# Patient Record
Sex: Male | Born: 1980
Health system: Southern US, Community
[De-identification: ages and names within clinical notes are randomized; demographics above are authoritative.]

## PROBLEM LIST (undated history)

## (undated) DIAGNOSIS — IMO0001 Reserved for inherently not codable concepts without codable children: Secondary | ICD-10-CM

## (undated) DIAGNOSIS — T1490XA Injury, unspecified, initial encounter: Secondary | ICD-10-CM

## (undated) HISTORY — DX: Injury, unspecified, initial encounter: T14.90XA

## (undated) HISTORY — DX: Reserved for inherently not codable concepts without codable children: IMO0001

## (undated) HISTORY — PX: WISDOM TOOTH EXTRACTION: SHX21

---

## 2015-02-24 ENCOUNTER — Encounter: Payer: Self-pay | Admitting: *Deleted

## 2015-02-24 ENCOUNTER — Telehealth: Payer: Self-pay | Admitting: *Deleted

## 2015-02-24 NOTE — Telephone Encounter (Signed)
Pre-Visit Call completed with patient and chart updated.   Pre-Visit Info documented in Specialty Comments under [employer].    

## 2015-02-25 ENCOUNTER — Ambulatory Visit (INDEPENDENT_AMBULATORY_CARE_PROVIDER_SITE_OTHER): Payer: BLUE CROSS/BLUE SHIELD | Admitting: Physician Assistant

## 2015-02-25 ENCOUNTER — Encounter: Payer: Self-pay | Admitting: Physician Assistant

## 2015-02-25 VITALS — BP 118/78 | HR 66 | Temp 97.9°F | Ht 71.25 in | Wt 171.2 lb

## 2015-02-25 DIAGNOSIS — Z0000 Encounter for general adult medical examination without abnormal findings: Secondary | ICD-10-CM

## 2015-02-25 DIAGNOSIS — M25531 Pain in right wrist: Secondary | ICD-10-CM | POA: Diagnosis not present

## 2015-02-25 DIAGNOSIS — Z23 Encounter for immunization: Secondary | ICD-10-CM | POA: Diagnosis not present

## 2015-02-25 DIAGNOSIS — M25532 Pain in left wrist: Secondary | ICD-10-CM

## 2015-02-25 NOTE — Assessment & Plan Note (Signed)
Depression screen negative. TDaP updated today. Preventive schedule reviewed and handout given in AVS. Will obtain fasting labs today.

## 2015-02-25 NOTE — Patient Instructions (Signed)
Please go to the lab for blood work. I will call you with your results.  Continue the Tylenol arthritis as directed. 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 Apply topical Aspercreme to the wrist. If labs normal and this worsens, we will want to get an x-ray to assess further.  Preventive Care for Adults A healthy lifestyle and preventive care can promote health and wellness. Preventive health guidelines for men include the following key practices:  A routine yearly physical is a good way to check with your health care provider about your health and preventative screening. It is a chance to share any concerns and updates on your health and to receive a thorough exam.  Visit your dentist for a routine exam and preventative care every 6 months. Brush your teeth twice a day and floss once a day. Good oral hygiene prevents tooth decay and gum disease.  The frequency of eye exams is based on your age, health, family medical history, use of contact lenses, and other factors. Follow your health care provider's recommendations for frequency of eye exams.  Eat a healthy diet. Foods such as vegetables, fruits, whole grains, low-fat dairy products, and lean protein foods contain the nutrients you need without too many calories. Decrease your intake of foods high in solid fats, added sugars, and salt. Eat the right amount of calories for you.Get information about a proper diet from your health care provider, if necessary.  Regular physical exercise is one of the most important things you can do for your health. Most adults should get at least 150 minutes of moderate-intensity exercise (any activity that increases your heart rate and causes you to sweat) each week. In addition, most adults need muscle-strengthening exercises on 2 or more days a week.  Maintain a healthy weight. The body mass index (BMI) is a screening tool to identify possible weight problems. It provides  an estimate of body fat based on height and weight. Your health care provider can find your BMI and can help you achieve or maintain a healthy weight.For adults 20 years and older:  A BMI below 18.5 is considered underweight.  A BMI of 18.5 to 24.9 is normal.  A BMI of 25 to 29.9 is considered overweight.  A BMI of 30 and above is considered obese.  Maintain normal blood lipids and cholesterol levels by exercising and minimizing your intake of saturated fat. Eat a balanced diet with plenty of fruit and vegetables. Blood tests for lipids and cholesterol should begin at age 2 and be repeated every 5 years. If your lipid or cholesterol levels are high, you are over 50, or you are at high risk for heart disease, you may need your cholesterol levels checked more frequently.Ongoing high lipid and cholesterol levels should be treated with medicines if diet and exercise are not working.  If you smoke, find out from your health care provider how to quit. If you do not use tobacco, do not start.  Lung cancer screening is recommended for adults aged 37-80 years who are at high risk for developing lung cancer because of a history of smoking. A yearly low-dose CT scan of the lungs is recommended for people who have at least a 30-pack-year history of smoking and are a current smoker or have quit within the past 15 years. A pack year of smoking is smoking an average of 1 pack of cigarettes a day for 1 year (for example: 1 pack a day for 30 years  or 2 packs a day for 15 years). Yearly screening should continue until the smoker has stopped smoking for at least 15 years. Yearly screening should be stopped for people who develop a health problem that would prevent them from having lung cancer treatment.  If you choose to drink alcohol, do not have more than 2 drinks per day. One drink is considered to be 12 ounces (355 mL) of beer, 5 ounces (148 mL) of wine, or 1.5 ounces (44 mL) of liquor.  Avoid use of street  drugs. Do not share needles with anyone. Ask for help if you need support or instructions about stopping the use of drugs.  High blood pressure causes heart disease and increases the risk of stroke. Your blood pressure should be checked at least every 1-2 years. Ongoing high blood pressure should be treated with medicines, if weight loss and exercise are not effective.  If you are 55-51 years old, ask your health care provider if you should take aspirin to prevent heart disease.  Diabetes screening involves taking a blood sample to check your fasting blood sugar level. This should be done once every 3 years, after age 61, if you are within normal weight and without risk factors for diabetes. Testing should be considered at a younger age or be carried out more frequently if you are overweight and have at least 1 risk factor for diabetes.  Colorectal cancer can be detected and often prevented. Most routine colorectal cancer screening begins at the age of 58 and continues through age 71. However, your health care provider may recommend screening at an earlier age if you have risk factors for colon cancer. On a yearly basis, your health care provider may provide home test kits to check for hidden blood in the stool. Use of a small camera at the end of a tube to directly examine the colon (sigmoidoscopy or colonoscopy) can detect the earliest forms of colorectal cancer. Talk to your health care provider about this at age 14, when routine screening begins. Direct exam of the colon should be repeated every 5-10 years through age 3, unless early forms of precancerous polyps or small growths are found.  People who are at an increased risk for hepatitis B should be screened for this virus. You are considered at high risk for hepatitis B if:  You were born in a country where hepatitis B occurs often. Talk with your health care provider about which countries are considered high risk.  Your parents were born in a  high-risk country and you have not received a shot to protect against hepatitis B (hepatitis B vaccine).  You have HIV or AIDS.  You use needles to inject street drugs.  You live with, or have sex with, someone who has hepatitis B.  You are a man who has sex with other men (MSM).  You get hemodialysis treatment.  You take certain medicines for conditions such as cancer, organ transplantation, and autoimmune conditions.  Hepatitis C blood testing is recommended for all people born from [birth year] through 1965 and any individual with known risks for hepatitis C.  Practice safe sex. Use condoms and avoid high-risk sexual practices to reduce the spread of sexually transmitted infections (STIs). STIs include gonorrhea, chlamydia, syphilis, trichomonas, herpes, HPV, and human immunodeficiency virus (HIV). Herpes, HIV, and HPV are viral illnesses that have no cure. They can result in disability, cancer, and death.  If you are at risk of being infected with HIV, it is recommended that  you take a prescription medicine daily to prevent HIV infection. This is called preexposure prophylaxis (PrEP). You are considered at risk if:  You are a man who has sex with other men (MSM) and have other risk factors.  You are a heterosexual man, are sexually active, and are at increased risk for HIV infection.  You take drugs by injection.  You are sexually active with a partner who has HIV.  Talk with your health care provider about whether you are at high risk of being infected with HIV. If you choose to begin PrEP, you should first be tested for HIV. You should then be tested every 3 months for as long as you are taking PrEP.  A one-time screening for abdominal aortic aneurysm (AAA) and surgical repair of large AAAs by ultrasound are recommended for men ages 65 to 57 years who are current or former smokers.  Healthy men should no longer receive prostate-specific antigen (PSA) blood tests as part of routine  cancer screening. Talk with your health care provider about prostate cancer screening.  Testicular cancer screening is not recommended for adult males who have no symptoms. Screening includes self-exam, a health care provider exam, and other screening tests. Consult with your health care provider about any symptoms you have or any concerns you have about testicular cancer.  Use sunscreen. Apply sunscreen liberally and repeatedly throughout the day. You should seek shade when your shadow is shorter than you. Protect yourself by wearing long sleeves, pants, a wide-brimmed hat, and sunglasses year round, whenever you are outdoors.  Once a month, do a whole-body skin exam, using a mirror to look at the skin on your back. Tell your health care provider about new moles, moles that have irregular borders, moles that are larger than a pencil eraser, or moles that have changed in shape or color.  Stay current with required vaccines (immunizations).  Influenza vaccine. All adults should be immunized every year.  Tetanus, diphtheria, and acellular pertussis (Td, Tdap) vaccine. An adult who has not previously received Tdap or who does not know his vaccine status should receive 1 dose of Tdap. This initial dose should be followed by tetanus and diphtheria toxoids (Td) booster doses every 10 years. Adults with an unknown or incomplete history of completing a 3-dose immunization series with Td-containing vaccines should begin or complete a primary immunization series including a Tdap dose. Adults should receive a Td booster every 10 years.  Varicella vaccine. An adult without evidence of immunity to varicella should receive 2 doses or a second dose if he has previously received 1 dose.  Human papillomavirus (HPV) vaccine. Males aged 10-21 years who have not received the vaccine previously should receive the 3-dose series. Males aged 22-26 years may be immunized. Immunization is recommended through the age of 79  years for any male who has sex with males and did not get any or all doses earlier. Immunization is recommended for any person with an immunocompromised condition through the age of 56 years if he did not get any or all doses earlier. During the 3-dose series, the second dose should be obtained 4-8 weeks after the first dose. The third dose should be obtained 24 weeks after the first dose and 16 weeks after the second dose.  Zoster vaccine. One dose is recommended for adults aged 13 years or older unless certain conditions are present.  Measles, mumps, and rubella (MMR) vaccine. Adults born before 55 generally are considered immune to measles and mumps. Adults  born in [place] or later should have 1 or more doses of MMR vaccine unless there is a contraindication to the vaccine or there is laboratory evidence of immunity to each of the three diseases. A routine second dose of MMR vaccine should be obtained at least 28 days after the first dose for students attending postsecondary schools, health care workers, or international travelers. People who received inactivated measles vaccine or an unknown type of measles vaccine during 1963-1967 should receive 2 doses of MMR vaccine. People who received inactivated mumps vaccine or an unknown type of mumps vaccine before 1979 and are at high risk for mumps infection should consider immunization with 2 doses of MMR vaccine. Unvaccinated health care workers born before [birth year] who lack laboratory evidence of measles, mumps, or rubella immunity or laboratory confirmation of disease should consider measles and mumps immunization with 2 doses of MMR vaccine or rubella immunization with 1 dose of MMR vaccine.  Pneumococcal 13-valent conjugate (PCV13) vaccine. When indicated, a person who is uncertain of his immunization history and has no record of immunization should receive the PCV13 vaccine. An adult aged 3 years or older who has certain medical conditions and has not been  previously immunized should receive 1 dose of PCV13 vaccine. This PCV13 should be followed with a dose of pneumococcal polysaccharide (PPSV23) vaccine. The PPSV23 vaccine dose should be obtained at least 8 weeks after the dose of PCV13 vaccine. An adult aged 66 years or older who has certain medical conditions and previously received 1 or more doses of PPSV23 vaccine should receive 1 dose of PCV13. The PCV13 vaccine dose should be obtained 1 or more years after the last PPSV23 vaccine dose.  Pneumococcal polysaccharide (PPSV23) vaccine. When PCV13 is also indicated, PCV13 should be obtained first. All adults aged 49 years and older should be immunized. An adult younger than age 15 years who has certain medical conditions should be immunized. Any person who resides in a nursing home or long-term care facility should be immunized. An adult smoker should be immunized. People with an immunocompromised condition and certain other conditions should receive both PCV13 and PPSV23 vaccines. People with human immunodeficiency virus (HIV) infection should be immunized as soon as possible after diagnosis. Immunization during chemotherapy or radiation therapy should be avoided. Routine use of PPSV23 vaccine is not recommended for American Indians, [place] Natives, or people younger than 65 years unless there are medical conditions that require PPSV23 vaccine. When indicated, people who have unknown immunization and have no record of immunization should receive PPSV23 vaccine. One-time revaccination 5 years after the first dose of PPSV23 is recommended for people aged 19-64 years who have chronic kidney failure, nephrotic syndrome, asplenia, or immunocompromised conditions. People who received 1-2 doses of PPSV23 before age 88 years should receive another dose of PPSV23 vaccine at age 45 years or later if at least 5 years have passed since the previous dose. Doses of PPSV23 are not needed for people immunized with PPSV23 at or  after age 32 years.  Meningococcal vaccine. Adults with asplenia or persistent complement component deficiencies should receive 2 doses of quadrivalent meningococcal conjugate (MenACWY-D) vaccine. The doses should be obtained at least 2 months apart. Microbiologists working with certain meningococcal bacteria, [place] recruits, people at risk during an outbreak, and people who travel to or live in countries with a high rate of meningitis should be immunized. A first-year college student up through age 46 years who is living in a residence hall should receive a  dose if he did not receive a dose on or after his 16th birthday. Adults who have certain high-risk conditions should receive one or more doses of vaccine.  Hepatitis A vaccine. Adults who wish to be protected from this disease, have certain high-risk conditions, work with hepatitis A-infected animals, work in hepatitis A research labs, or travel to or work in countries with a high rate of hepatitis A should be immunized. Adults who were previously unvaccinated and who anticipate close contact with an international adoptee during the first 60 days after arrival in the [place] from a country with a high rate of hepatitis A should be immunized.  Hepatitis B vaccine. Adults should be immunized if they wish to be protected from this disease, have certain high-risk conditions, may be exposed to blood or other infectious body fluids, are household contacts or sex partners of hepatitis B positive people, are clients or workers in certain care facilities, or travel to or work in countries with a high rate of hepatitis B.  Haemophilus influenzae type b (Hib) vaccine. A previously unvaccinated person with asplenia or sickle cell disease or having a scheduled splenectomy should receive 1 dose of Hib vaccine. Regardless of previous immunization, a recipient of a hematopoietic stem cell transplant should receive a 3-dose series 6-12 months after his  successful transplant. Hib vaccine is not recommended for adults with HIV infection. Preventive Service / Frequency Ages 56 to 43  Blood pressure check.** / Every 1 to 2 years.  Lipid and cholesterol check.** / Every 5 years beginning at age 72.  Hepatitis C blood test.** / For any individual with known risks for hepatitis C.  Skin self-exam. / Monthly.  Influenza vaccine. / Every year.  Tetanus, diphtheria, and acellular pertussis (Tdap, Td) vaccine.** / Consult your health care provider. 1 dose of Td every 10 years.  Varicella vaccine.** / Consult your health care provider.  HPV vaccine. / 3 doses over 6 months, if 64 or younger.  Measles, mumps, rubella (MMR) vaccine.** / You need at least 1 dose of MMR if you were born in 1957 or later. You may also need a second dose.  Pneumococcal 13-valent conjugate (PCV13) vaccine.** / Consult your health care provider.  Pneumococcal polysaccharide (PPSV23) vaccine.** / 1 to 2 doses if you smoke cigarettes or if you have certain conditions.  Meningococcal vaccine.** / 1 dose if you are age 51 to 101 years and a Market researcher living in a residence hall, or have one of several medical conditions. You may also need additional booster doses.  Hepatitis A vaccine.** / Consult your health care provider.  Hepatitis B vaccine.** / Consult your health care provider.  Haemophilus influenzae type b (Hib) vaccine.** / Consult your health care provider. Ages 54 to 8  Blood pressure check.** / Every 1 to 2 years.  Lipid and cholesterol check.** / Every 5 years beginning at age 53.  Lung cancer screening. / Every year if you are aged 37-80 years and have a 30-pack-year history of smoking and currently smoke or have quit within the past 15 years. Yearly screening is stopped once you have quit smoking for at least 15 years or develop a health problem that would prevent you from having lung cancer treatment.  Fecal occult blood test (FOBT)  of stool. / Every year beginning at age 58 and continuing until age 59. You may not have to do this test if you get a colonoscopy every 10 years.  Flexible sigmoidoscopy** or colonoscopy.** /  Every 5 years for a flexible sigmoidoscopy or every 10 years for a colonoscopy beginning at age 63 and continuing until age 16.  Hepatitis C blood test.** / For all people born from [birth year] through 1965 and any individual with known risks for hepatitis C.  Skin self-exam. / Monthly.  Influenza vaccine. / Every year.  Tetanus, diphtheria, and acellular pertussis (Tdap/Td) vaccine.** / Consult your health care provider. 1 dose of Td every 10 years.  Varicella vaccine.** / Consult your health care provider.  Zoster vaccine.** / 1 dose for adults aged 18 years or older.  Measles, mumps, rubella (MMR) vaccine.** / You need at least 1 dose of MMR if you were born in 1957 or later. You may also need a second dose.  Pneumococcal 13-valent conjugate (PCV13) vaccine.** / Consult your health care provider.  Pneumococcal polysaccharide (PPSV23) vaccine.** / 1 to 2 doses if you smoke cigarettes or if you have certain conditions.  Meningococcal vaccine.** / Consult your health care provider.  Hepatitis A vaccine.** / Consult your health care provider.  Hepatitis B vaccine.** / Consult your health care provider.  Haemophilus influenzae type b (Hib) vaccine.** / Consult your health care provider. Ages 78 and over  Blood pressure check.** / Every 1 to 2 years.  Lipid and cholesterol check.**/ Every 5 years beginning at age 1.  Lung cancer screening. / Every year if you are aged 67-80 years and have a 30-pack-year history of smoking and currently smoke or have quit within the past 15 years. Yearly screening is stopped once you have quit smoking for at least 15 years or develop a health problem that would prevent you from having lung cancer treatment.  Fecal occult blood test (FOBT) of stool. / Every year  beginning at age 43 and continuing until age 32. You may not have to do this test if you get a colonoscopy every 10 years.  Flexible sigmoidoscopy** or colonoscopy.** / Every 5 years for a flexible sigmoidoscopy or every 10 years for a colonoscopy beginning at age 39 and continuing until age 90.  Hepatitis C blood test.** / For all people born from [birth year] through 1965 and any individual with known risks for hepatitis C.  Abdominal aortic aneurysm (AAA) screening.** / A one-time screening for ages 3 to 27 years who are current or former smokers.  Skin self-exam. / Monthly.  Influenza vaccine. / Every year.  Tetanus, diphtheria, and acellular pertussis (Tdap/Td) vaccine.** / 1 dose of Td every 10 years.  Varicella vaccine.** / Consult your health care provider.  Zoster vaccine.** / 1 dose for adults aged 40 years or older.  Pneumococcal 13-valent conjugate (PCV13) vaccine.** / Consult your health care provider.  Pneumococcal polysaccharide (PPSV23) vaccine.** / 1 dose for all adults aged 78 years and older.  Meningococcal vaccine.** / Consult your health care provider.  Hepatitis A vaccine.** / Consult your health care provider.  Hepatitis B vaccine.** / Consult your health care provider.  Haemophilus influenzae type b (Hib) vaccine.** / Consult your health care provider. **Family history and personal history of risk and conditions may change your health care provider's recommendations. Document Released: 09/13/2001 Document Revised: 07/23/2013 Document Reviewed: 12/13/2010 [facility] Patient Information 2015 [place], [place]. This information is not intended to replace advice given to you by your health care provider. Make sure you discuss any questions you have with your health care provider.

## 2015-02-25 NOTE — Assessment & Plan Note (Signed)
Suspect OA. Extremities are neurovascularly intact. Continue Tylenol Arthritis. Place Aspercreme to the area.

## 2015-02-25 NOTE — Progress Notes (Signed)
Patient presents to establish care. Patient requesting CPE. Patient is fasting for labs.  Acute Concerns: Patient complains of intermittent wrist pain, mostly posterior . Denies history of arthritis. Was an athlete in school so he knows there is wear and tear on the joints. No history of autoimmune disease. Notes mild fa7+ hours of sleep per night. Well-balanced diet. Gym 1.5-2 hours per day.  Chronic Issues: None reported.  Health Maintenance: Dental -- up-to-date Vision -- up-to-date Immunizations -- Due for Tetanus  Past Medical History  Diagnosis Date  . Healthy adult   . Sports injury     Past Surgical History  Procedure Laterality Date  . Wisdom tooth extraction      Current Outpatient Prescriptions on File Prior to Visit  Medication Sig Dispense Refill  . Acetaminophen (TYLENOL ARTHRITIS PAIN PO) Take 1 tablet by mouth daily.    . Cyanocobalamin (B-12 PO) Take 1 tablet by mouth daily.     No current facility-administered medications on file prior to visit.    No Known Allergies  Family History  Problem Relation Age of Onset  . Arthritis Mother   . Hyperlipidemia Mother   . Stroke Mother   . Hypertension Mother   . Mental illness Mother   . Cancer Mother     Brain   . Diabetes Mother   . Arthritis Father   . Hyperlipidemia Father   . Hypertension Father   . Hyperlipidemia Maternal Grandmother   . Heart disease Maternal Grandmother   . Cancer Maternal Grandfather     Prostate  . Hyperlipidemia Maternal Grandfather   . Heart disease Maternal Grandfather   . Stroke Maternal Grandfather   . Diabetes Maternal Grandfather   . Hyperlipidemia Paternal Grandmother   . Heart disease Paternal Grandmother   . Hyperlipidemia Paternal Grandfather   . Heart disease Paternal Grandfather     History   Social History  . Marital Status: Married    Spouse Name: Marcelino Duster  . Number of Children: 0  . Years of Education: N/A   Occupational History  . Salesman     Social History Main Topics  . Smoking status: Never Smoker   . Smokeless tobacco: Never Used  . Alcohol Use: 3.0 oz/week    5 Standard drinks or equivalent per week  . Drug Use: No  . Sexual Activity:    Partners: Female   Other Topics Concern  . Not on file   Social History Narrative   Review of Systems  Constitutional: Positive for malaise/fatigue. Negative for fever and weight loss.  HENT: Negative for ear discharge, ear pain, hearing loss and tinnitus.   Eyes: Negative for blurred vision, double vision, photophobia and pain.  Respiratory: Negative for cough and shortness of breath.   Cardiovascular: Negative for chest pain and palpitations.  Gastrointestinal: Negative for heartburn, nausea, vomiting, abdominal pain, diarrhea, constipation, blood in stool and melena.  Genitourinary: Negative for dysuria, urgency, frequency, hematuria and flank pain.  Musculoskeletal: Positive for joint pain. Negative for falls.  Neurological: Negative for dizziness, loss of consciousness and headaches.  Endo/Heme/Allergies: Negative for environmental allergies.  Psychiatric/Behavioral: Negative for depression, suicidal ideas, hallucinations and substance abuse. The patient is not nervous/anxious and does not have insomnia.    BP 118/78 mmHg  Pulse 66  Temp(Src) 97.9 F (36.6 C) (Oral)  Ht 5' 11.25" (1.81 m)  Wt 171 lb 3.2 oz (77.656 kg)  BMI 23.70 kg/m2  SpO2 99%  Physical Exam  Constitutional: He is oriented  to person, place, and time and well-developed, well-nourished, and in no distress.  HENT:  Head: Normocephalic and atraumatic.  Right Ear: External ear normal.  Left Ear: External ear normal.  Nose: Nose normal.  Mouth/Throat: Oropharynx is clear and moist. No oropharyngeal exudate.  Eyes: Conjunctivae and EOM are normal. Pupils are equal, round, and reactive to light.  Neck: Neck supple. No thyromegaly present.  Cardiovascular: Normal rate, regular rhythm, normal heart  sounds and intact distal pulses.   Pulmonary/Chest: Effort normal and breath sounds normal. No respiratory distress. He has no wheezes. He has no rales. He exhibits no tenderness.  Abdominal: Soft. Bowel sounds are normal. He exhibits no distension and no mass. There is no tenderness. There is no rebound and no guarding.  Genitourinary: Testes/scrotum normal and penis normal. No discharge found.  Lymphadenopathy:    He has no cervical adenopathy.  Neurological: He is alert and oriented to person, place, and time.  Skin: Skin is warm and dry. No rash noted.  Psychiatric: Affect normal.  Vitals reviewed.  Assessment/Plan: Visit for preventive health examination Depression screen negative. TDaP updated today. Preventive schedule reviewed and handout given in AVS. Will obtain fasting labs today.  Pain in both wrists Suspect OA. Extremities are neurovascularly intact. Continue Tylenol Arthritis. Place Aspercreme to the area.

## 2015-02-25 NOTE — Progress Notes (Signed)
Pre visit review using our clinic review tool, if applicable. No additional management support is needed unless otherwise documented below in the visit note. 

## 2015-02-26 LAB — URINALYSIS, ROUTINE W REFLEX MICROSCOPIC
BILIRUBIN URINE: NEGATIVE
Hgb urine dipstick: NEGATIVE
Leukocytes, UA: NEGATIVE
Nitrite: NEGATIVE
RBC / HPF: NONE SEEN (ref 0–?)
Specific Gravity, Urine: 1.02 (ref 1.000–1.030)
Total Protein, Urine: NEGATIVE
URINE GLUCOSE: NEGATIVE
Urobilinogen, UA: 0.2 (ref 0.0–1.0)
WBC UA: NONE SEEN (ref 0–?)
pH: 6 (ref 5.0–8.0)

## 2015-02-26 LAB — LIPID PANEL
Cholesterol: 192 mg/dL (ref 0–200)
HDL: 62.9 mg/dL (ref >39.00)
LDL Cholesterol: 114 mg/dL — ABNORMAL HIGH (ref 0–99)
NONHDL: 129.52
Total CHOL/HDL Ratio: 3
Triglycerides: 77 mg/dL (ref 0.0–149.0)
VLDL: 15.4 mg/dL (ref 0.0–40.0)

## 2015-02-26 LAB — COMPREHENSIVE METABOLIC PANEL
ALT: 27 U/L (ref 0–53)
AST: 26 U/L (ref 0–37)
Albumin: 4.5 g/dL (ref 3.5–5.2)
Alkaline Phosphatase: 47 U/L (ref 39–117)
BILIRUBIN TOTAL: 0.7 mg/dL (ref 0.2–1.2)
BUN: 26 mg/dL — AB (ref 6–23)
CHLORIDE: 101 meq/L (ref 96–112)
CO2: 28 mEq/L (ref 19–32)
CREATININE: 1.24 mg/dL (ref 0.40–1.50)
Calcium: 9.4 mg/dL (ref 8.4–10.5)
GFR: 70.99 mL/min (ref >60.00)
Glucose, Bld: 79 mg/dL (ref 70–99)
POTASSIUM: 3.9 meq/L (ref 3.5–5.1)
Sodium: 138 mEq/L (ref 135–145)
Total Protein: 7.5 g/dL (ref 6.0–8.3)

## 2015-02-26 LAB — SEDIMENTATION RATE: SED RATE: 5 mm/h (ref 0–22)

## 2015-02-26 LAB — CBC
HCT: 45.3 % (ref 39.0–52.0)
Hemoglobin: 15.3 g/dL (ref 13.0–17.0)
MCHC: 33.8 g/dL (ref 30.0–36.0)
MCV: 88.9 fl (ref 78.0–100.0)
PLATELETS: 268 10*3/uL (ref 150.0–400.0)
RBC: 5.09 Mil/uL (ref 4.22–5.81)
RDW: 14 % (ref 11.5–15.5)
WBC: 5.3 10*3/uL (ref 4.0–10.5)

## 2015-02-26 LAB — TSH: TSH: 1 u[IU]/mL (ref 0.35–4.50)

## 2015-02-27 NOTE — Addendum Note (Signed)
Addended by: Verdie Shire on: 02/27/2015 05:50 PM   Modules accepted: Orders

## 2016-09-08 DIAGNOSIS — F4322 Adjustment disorder with anxiety: Secondary | ICD-10-CM | POA: Diagnosis not present

## 2016-09-19 ENCOUNTER — Ambulatory Visit (HOSPITAL_BASED_OUTPATIENT_CLINIC_OR_DEPARTMENT_OTHER)
Admission: RE | Admit: 2016-09-19 | Discharge: 2016-09-19 | Disposition: A | Discharge status: Home / Self Care | Payer: BLUE CROSS/BLUE SHIELD | Source: Ambulatory Visit | Attending: Family | Admitting: Family

## 2016-09-19 ENCOUNTER — Encounter: Payer: Self-pay | Admitting: Family

## 2016-09-19 ENCOUNTER — Ambulatory Visit (INDEPENDENT_AMBULATORY_CARE_PROVIDER_SITE_OTHER): Payer: BLUE CROSS/BLUE SHIELD | Admitting: Family

## 2016-09-19 VITALS — BP 140/78 | HR 55 | Temp 97.8°F | Ht 71.25 in | Wt 180.0 lb

## 2016-09-19 DIAGNOSIS — M542 Cervicalgia: Secondary | ICD-10-CM | POA: Diagnosis not present

## 2016-09-19 DIAGNOSIS — M7989 Other specified soft tissue disorders: Secondary | ICD-10-CM | POA: Diagnosis not present

## 2016-09-19 MED ORDER — MELOXICAM 7.5 MG PO TABS: 7.5000 mg | ORAL_TABLET | Freq: Every day | ORAL | 0 refills | Status: DC

## 2016-09-19 MED FILL — MELOXICAM 7.5 MG TABLET: 7.5 | 14 days supply | Qty: 14 | Fill #0

## 2016-09-19 NOTE — Patient Instructions (Signed)
Begin meloxicam (anti-inflammatory) for your neck and finger pain. Call if new/worsening symptoms or if symptoms are not improved in 1 month.

## 2016-09-19 NOTE — Progress Notes (Signed)
Pre visit review using our clinic review tool, if applicable. No additional management support is needed unless otherwise documented below in the visit note. 

## 2016-09-19 NOTE — Progress Notes (Signed)
Subjective:    Patient ID: Craig Russo, male    DOB: 16-Dec-1980, 36 y.o.   MRN: [id number]  HPI  Craig Russo is a 36 yr old male who presents today with chief complaint of neck pain. Reports intermittent neck pain. Reports that he has had intermittent problems with neck pain for some time, but recently pain worsened.  Notes a "crunching" and decreased ROM of his neck.  Reports that it occurs with pull downs and overhead presses.  Denies associated arm pain, UE weakness or numbness except for a torn rotator cough.  Notes that occasionally his hands "fall asleep."  He does work on a [employer].   Reports that his finger got twisted while using a lathe in his garage a few months back.    BP Readings from Last 3 Encounters:  09/19/16 140/78  02/25/15 118/78    Review of Systems See HPI  Past Medical History:  Diagnosis Date  . Healthy adult   . Sports injury      Social History   Social History  . Marital status: Married    Spouse name: Marcelino Duster  . Number of children: 0  . Years of education: N/A   Occupational History  . Salesman    Social History Main Topics  . Smoking status: Never Smoker  . Smokeless tobacco: Never Used  . Alcohol use 3.0 oz/week    5 Standard drinks or equivalent per week  . Drug use: No  . Sexual activity: Yes    Partners: Female   Other Topics Concern  . Not on file   Social History Narrative  . No narrative on file    Past Surgical History:  Procedure Laterality Date  . WISDOM TOOTH EXTRACTION      Family History  Problem Relation Age of Onset  . Arthritis Mother   . Hyperlipidemia Mother   . Stroke Mother   . Hypertension Mother   . Mental illness Mother   . Cancer Mother     Brain   . Diabetes Mother   . Arthritis Father   . Hyperlipidemia Father   . Hypertension Father   . Hyperlipidemia Maternal Grandmother   . Heart disease Maternal Grandmother   . Cancer Maternal Grandfather     Prostate  . Hyperlipidemia Maternal  Grandfather   . Heart disease Maternal Grandfather   . Stroke Maternal Grandfather   . Diabetes Maternal Grandfather   . Hyperlipidemia Paternal Grandmother   . Heart disease Paternal Grandmother   . Hyperlipidemia Paternal Grandfather   . Heart disease Paternal Grandfather     No Known Allergies  No current outpatient prescriptions on file prior to visit.   No current facility-administered medications on file prior to visit.     BP 140/78 (BP Location: Right Arm, Cuff Size: Large)   Pulse (!) 55   Temp 97.8 F (36.6 C) (Oral)   Ht 5' 11.25" (1.81 m)   Wt 180 lb (81.6 kg)   SpO2 100% Comment: room air  BMI 24.93 kg/m       Objective:   Physical Exam  Constitutional: He is oriented to person, place, and time. He appears well-developed and well-nourished. No distress.  HENT:  Head: Normocephalic and atraumatic.  Cardiovascular: Normal rate and regular rhythm.   No murmur heard. Pulmonary/Chest: Effort normal and breath sounds normal. No respiratory distress. He has no wheezes. He has no rales.  Musculoskeletal: He exhibits no edema.       Cervical back: He  exhibits no tenderness.       Thoracic back: He exhibits no tenderness.       Lumbar back: He exhibits no bony tenderness.  Neurological: He is alert and oriented to person, place, and time.  Reflex Scores:      Bicep reflexes are 2+ on the right side and 2+ on the left side.      Brachioradialis reflexes are 2+ on the right side and 2+ on the left side.      Patellar reflexes are 2+ on the right side and 2+ on the left side. Bilateral UE/LE strength is 5/5,  Decreased flexion/extension of neck.   + swelling of the right 4th MIP.    Skin: Skin is warm and dry.  Psychiatric: He has a normal mood and affect. His behavior is normal. Thought content normal.          Assessment & Plan:  Neck pain- will obtain x-ray of the cspine. Suspect degenerative changes of the cspine.  Trial of meloxicam. Advised pt to avoid  exercises which worsen his pain- consider using lighter weights/more reps.   Finger swelling- trial of meloxicam.  Advised pt to let me know if swelling worsens or if it fails to improve and we can consider referral to a hand specialist.

## 2016-09-21 DIAGNOSIS — F4322 Adjustment disorder with anxiety: Secondary | ICD-10-CM | POA: Diagnosis not present

## 2016-10-19 DIAGNOSIS — F4322 Adjustment disorder with anxiety: Secondary | ICD-10-CM | POA: Diagnosis not present

## 2017-04-18 IMAGING — CR DG CERVICAL SPINE COMPLETE 4+V
5 series · 5 of 5 positions shown · non-contrast
Comparison: No recent prior.

CLINICAL DATA: Neck and shoulder pain.

EXAM:
CERVICAL SPINE - COMPLETE 4+ VIEW

[w c-spine lat]
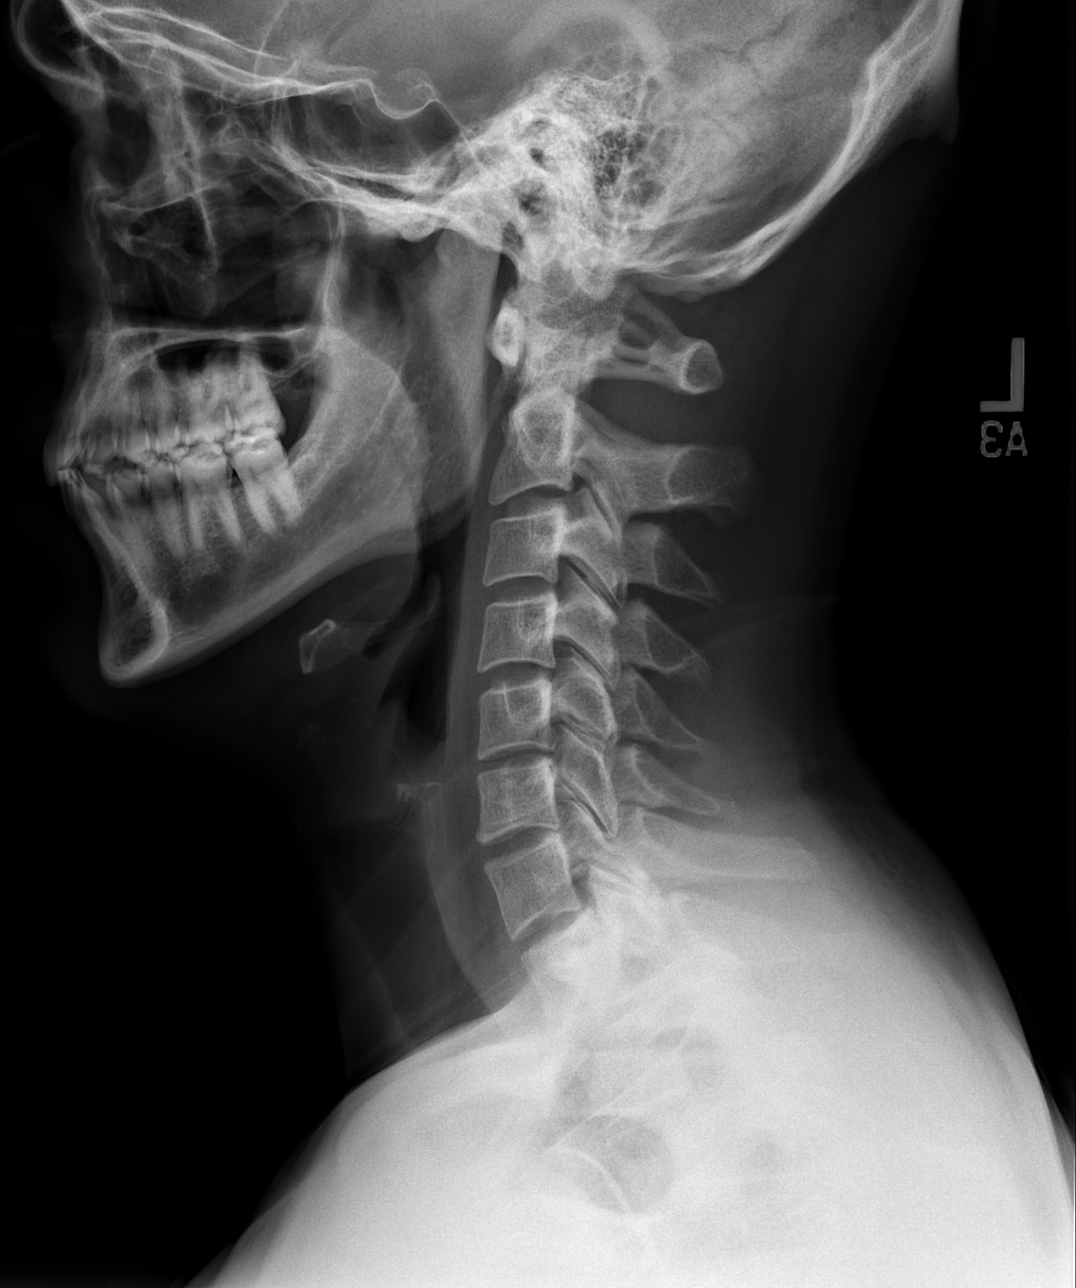

[w c-spine oblique (1 of 2)]
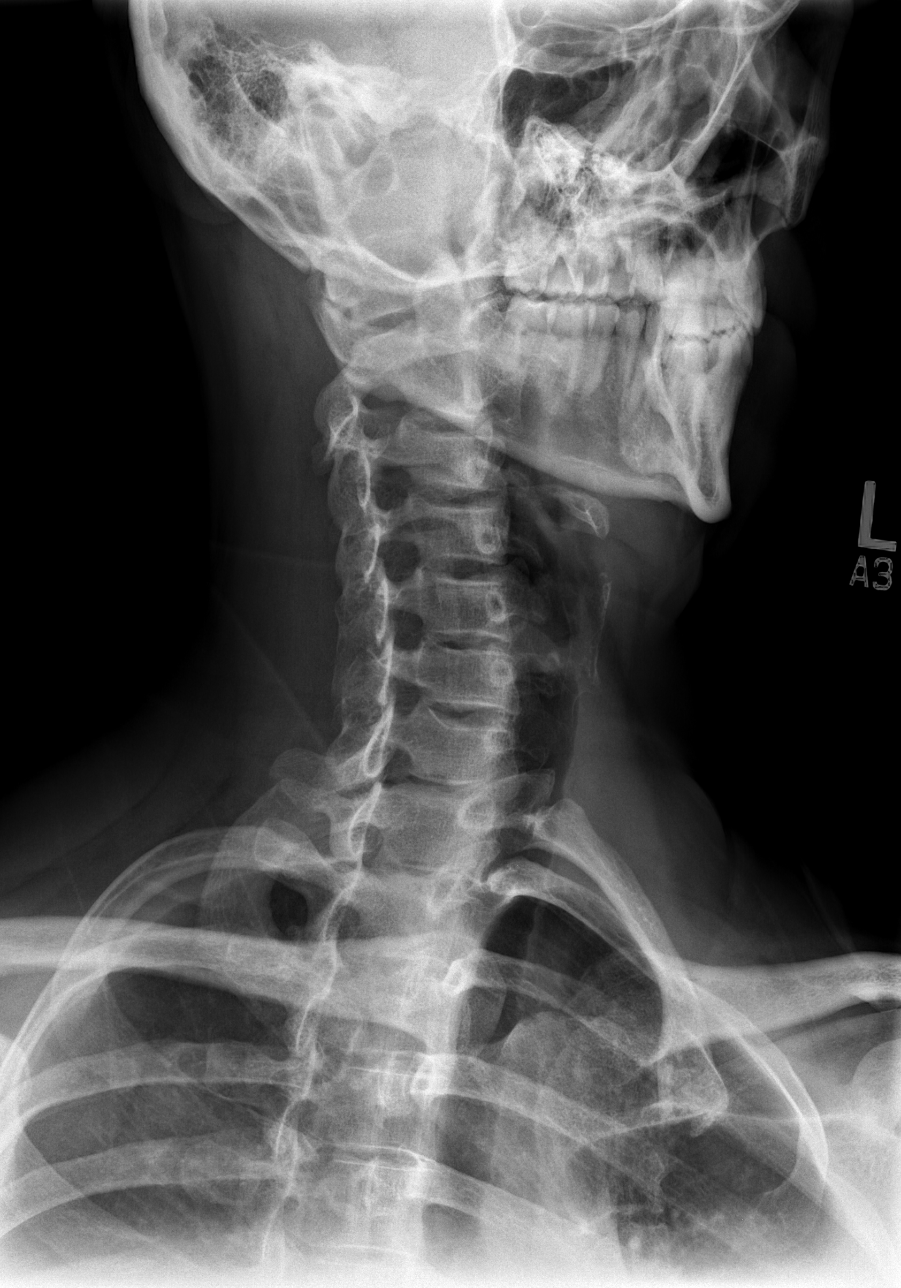

[w c-spine oblique (2 of 2)]
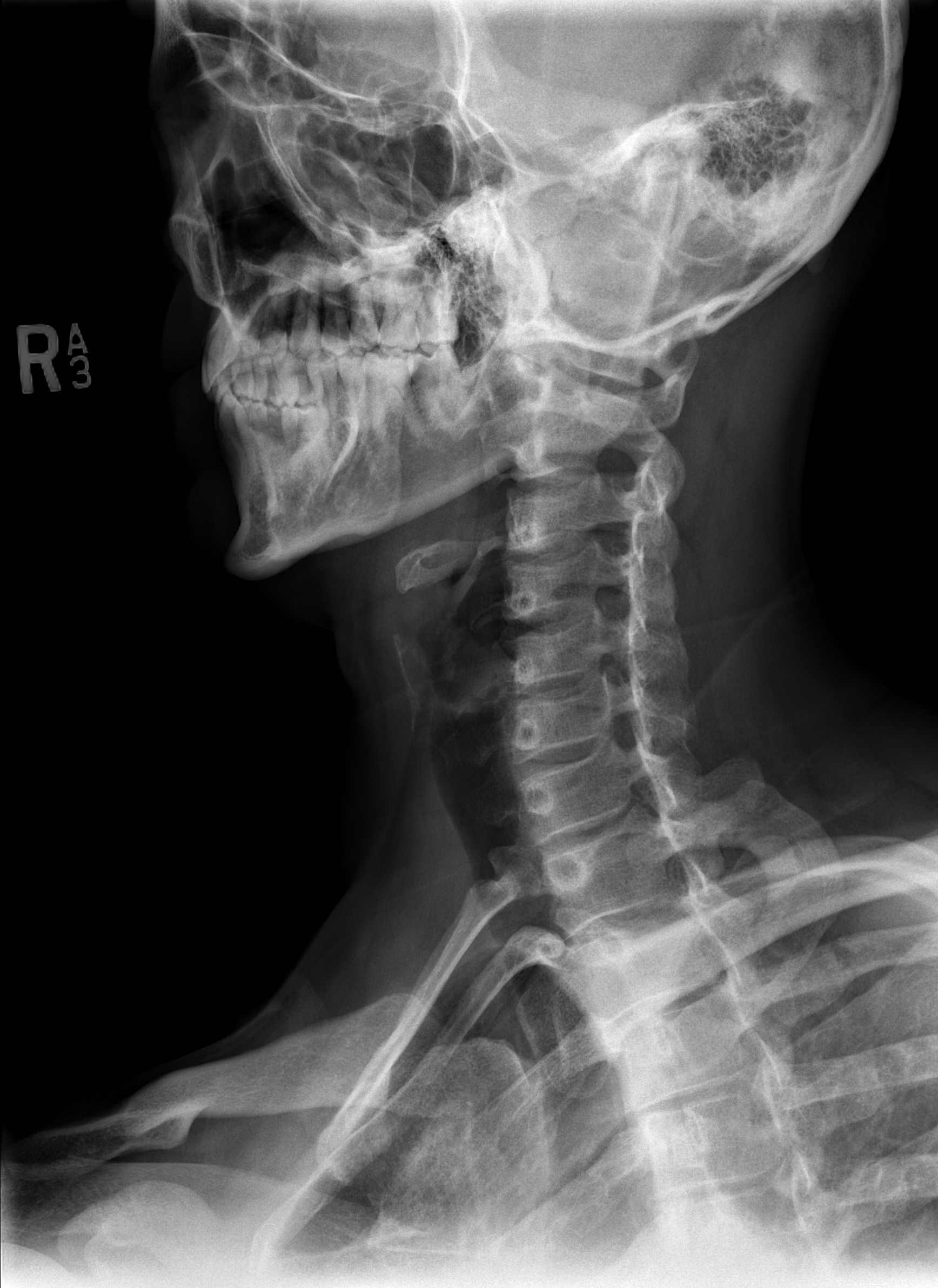

[w c-spine a.p.]
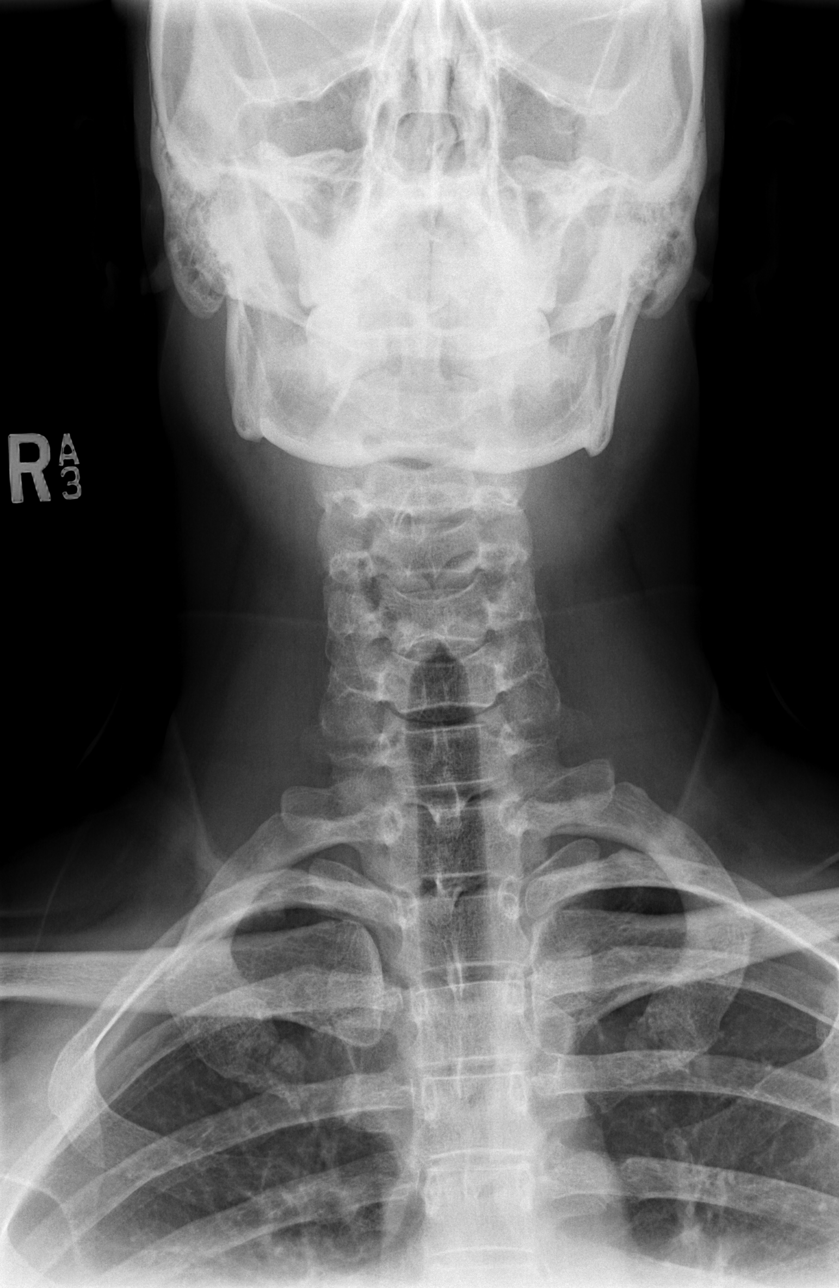

[w c-spine odontoid]
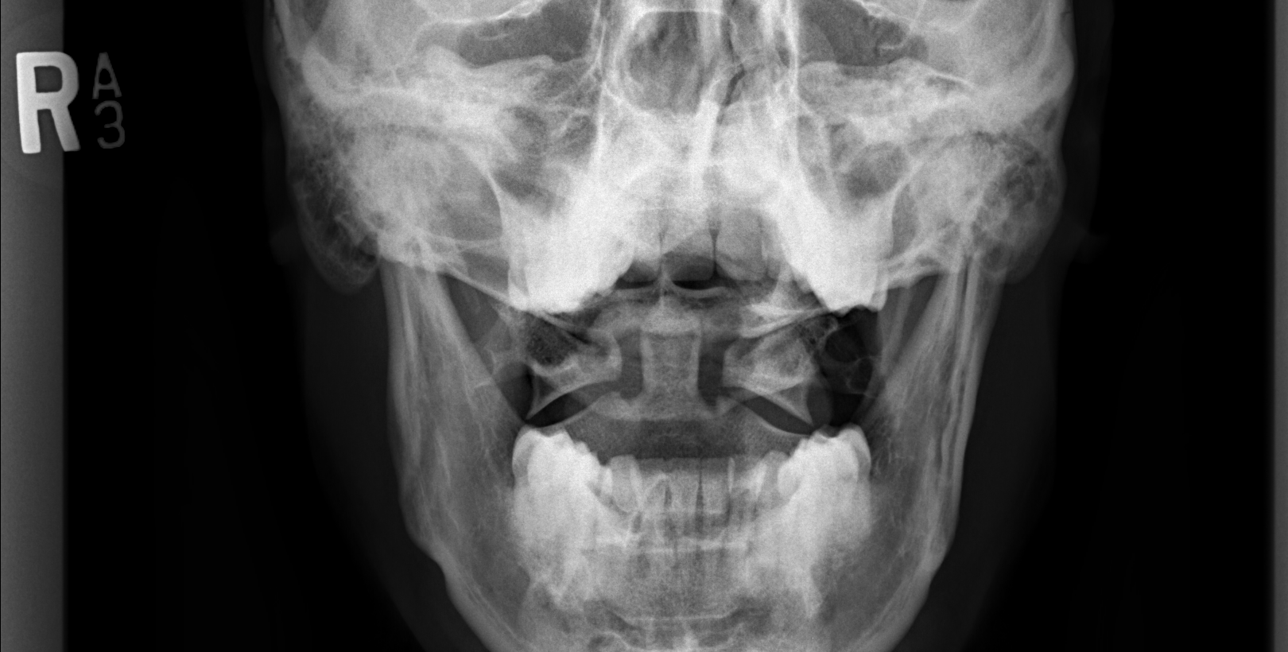

[5 of 5 positions shown; findings below may reference images not displayed]

FINDINGS: No acute bony or joint abnormality identified. No evidence of
fracture or dislocation.
IMPRESSION: No acute or focal abnormality.

## 2017-11-13 DIAGNOSIS — H04123 Dry eye syndrome of bilateral lacrimal glands: Secondary | ICD-10-CM | POA: Diagnosis not present

## 2017-12-21 ENCOUNTER — Encounter: Payer: Self-pay | Admitting: Emergency Medicine

## 2018-10-10 ENCOUNTER — Encounter: Payer: Self-pay | Admitting: Internal Medicine

## 2018-10-10 ENCOUNTER — Ambulatory Visit: Payer: BLUE CROSS/BLUE SHIELD | Admitting: Internal Medicine

## 2018-10-10 ENCOUNTER — Other Ambulatory Visit: Payer: Self-pay

## 2018-10-10 VITALS — BP 120/80 | HR 59 | Temp 98.0°F | Ht 71.0 in | Wt 182.8 lb

## 2018-10-10 DIAGNOSIS — M545 Low back pain: Secondary | ICD-10-CM

## 2018-10-10 DIAGNOSIS — G8929 Other chronic pain: Secondary | ICD-10-CM

## 2018-10-10 NOTE — Progress Notes (Signed)
New Patient Office Visit     CC/Reason for Visit: back pain Previous PCP: Marcelline Mates PA Last Visit:   HPI: Craig Russo is a 38 y.o. male who is coming in today for the above mentioned reasons. Past Medical History is significant for wisdom teeth removal.  Patient is a young healthy adult.  Patient works at [employer].  Patient is married with no children.  Patient does not smoke, but drinks 5-7 liquor on the weekends.  Patient c/o back pain that started in September of last year in his mid lower back.  Denies injury.  Patient runs 2-3 days a week and goes to the gym most days a week.  Patients says the pain is worse when he is standing for long periods of time or lying in bed at night.  Patient says it is very stiff in the am, but gets better throughout the morning.  Patient takes aleve as needed as pain.  Denies urine difficulty or fever. No radiculopathy.   Past Medical/Surgical History: Past Medical History:  Diagnosis Date  . Healthy adult   . Sports injury     Past Surgical History:  Procedure Laterality Date  . WISDOM TOOTH EXTRACTION      Social History:  reports that he has never smoked. He has never used smokeless tobacco. He reports current alcohol use of about 5.0 standard drinks of alcohol per week. He reports that he does not use drugs.  Allergies: No Known Allergies  Family History:  Family History  Problem Relation Age of Onset  . Arthritis Mother   . Hyperlipidemia Mother   . Stroke Mother   . Hypertension Mother   . Mental illness Mother   . Cancer Mother        Brain   . Diabetes Mother   . Arthritis Father   . Hyperlipidemia Father   . Hypertension Father   . Hyperlipidemia Maternal Grandmother   . Heart disease Maternal Grandmother   . Cancer Maternal Grandfather        Prostate  . Hyperlipidemia Maternal Grandfather   . Heart disease Maternal Grandfather   . Stroke Maternal Grandfather   . Diabetes Maternal Grandfather   .  Hyperlipidemia Paternal Grandmother   . Heart disease Paternal Grandmother   . Hyperlipidemia Paternal Grandfather   . Heart disease Paternal Grandfather      Current Outpatient Medications:  .  glucosamine-chondroitin 500-400 MG tablet, Take by mouth 3 (three) times daily., Disp: , Rfl:   Review of Systems:  Constitutional: Denies fever, chills, diaphoresis, appetite change and fatigue.  HEENT: Denies photophobia, eye pain, redness, hearing loss, ear pain, congestion, sore throat, rhinorrhea, sneezing, mouth sores, trouble swallowing, neck pain, neck stiffness and tinnitus.   Respiratory: Denies SOB, DOE, cough, chest tightness,  and wheezing.   Cardiovascular: Denies chest pain, palpitations and leg swelling.  Gastrointestinal: Denies nausea, vomiting, abdominal pain, diarrhea, constipation, blood in stool and abdominal distention.  Genitourinary: Denies dysuria, urgency, frequency, hematuria, flank pain and difficulty urinating.  Endocrine: Denies: hot or cold intolerance, sweats, changes in hair or nails, polyuria, polydipsia. Musculoskeletal: Denies joint swelling, arthralgias and gait problem. C/o lower back pain Skin: Denies pallor, rash and wound.  Neurological: Denies dizziness, seizures, syncope, weakness, light-headedness, numbness and headaches.  Hematological: Denies adenopathy. Easy bruising, personal or family bleeding history  Psychiatric/Behavioral: Denies suicidal ideation, mood changes, confusion, nervousness, sleep disturbance and agitation   Physical Exam: Vitals:   10/10/18 1025  BP: 120/80  Pulse: (!) 59  Temp: 98 F (36.7 C)  TempSrc: Oral  SpO2: 98%  Weight: 182 lb 12.8 oz (82.9 kg)  Height: 5\' 11"  (1.803 m)   Body mass index is 25.5 kg/m.   Constitutional: NAD, calm, comfortable Eyes: PERRL, lids and conjunctivae normal Respiratory: clear to auscultation bilaterally, no wheezing, no crackles. Normal respiratory effort. No accessory muscle use.   Cardiovascular: Regular rate and rhythm, no murmurs / rubs / gallops. No extremity edema. 2+ pedal pulses.  Musculoskeletal: no clubbing / cyanosis. No joint deformity upper and lower extremities. Good ROM, no contractures. Back muscles are extremely firm/tense Psychiatric: Normal judgment and insight. Alert and oriented x 3. Normal mood.    Impression and Plan:  Chronic bilateral low back pain without sciatica -Local therapies: massage therapy, icing. -take motrin as needed -discussed possible physical therapy -handout on chronic back pain and back stretches -RTC if no improvement in 2 weeks, can consider PT and muscle relaxers. -Do not believe imaging is necessary at this time.    Patient Instructions  Great meeting you today.  Instructions: -Massage therapy twice a week for a couple months then monthly as maintenance -apply ice as needed, aim for twice a day -Take Motrin as needed -Back stretches daily.   Chronic Back Pain When back pain lasts longer than 3 months, it is called chronic back pain. Pain may get worse at certain times (flare-ups). There are things you can do at home to manage your pain. Follow these instructions at home: Activity      Avoid bending and other activities that make pain worse.  When standing: ? Keep your upper back and neck straight. ? Keep your shoulders pulled back. ? Avoid slouching.  When sitting: ? Keep your back straight. ? Relax your shoulders. Do not round your shoulders or pull them backward.  Do not sit or stand in one place for long periods of time.  Take short rest breaks during the day. Lying down or standing is usually better than sitting. Resting can help relieve pain.  When sitting or lying down for a long time, do some mild activity or stretching. This will help to prevent stiffness and pain.  Get regular exercise. Ask your doctor what activities are safe for you.  Do not lift anything that is heavier than 10 lb  (4.5 kg). To prevent injury when you lift things: ? Bend your knees. ? Keep the weight close to your body. ? Avoid twisting. Managing pain  If told, put ice on the painful area. Your doctor may tell you to use ice for 24-48 hours after a flare-up starts. ? Put ice in a plastic bag. ? Place a towel between your skin and the bag. ? Leave the ice on for 20 minutes, 2-3 times a day.  If told, put heat on the painful area as often as told by your doctor. Use the heat source that your doctor recommends, such as a moist heat pack or a heating pad. ? Place a towel between your skin and the heat source. ? Leave the heat on for 20-30 minutes. ? Remove the heat if your skin turns bright red. This is especially important if you are unable to feel pain, heat, or cold. You may have a greater risk of getting burned.  Soak in a warm bath. This can help relieve pain.  Take over-the-counter and prescription medicines only as told by your doctor. General instructions  Sleep on a firm mattress. Try lying on  your side with your knees slightly bent. If you lie on your back, put a pillow under your knees.  Keep all follow-up visits as told by your doctor. This is important. Contact a doctor if:  You have pain that does not get better with rest or medicine. Get help right away if:  One or both of your arms or legs feel weak.  One or both of your arms or legs lose feeling (numbness).  You have trouble controlling when you poop (bowel movement) or pee (urinate).  You feel sick to your stomach (nauseous).  You throw up (vomit).  You have belly (abdominal) pain.  You have shortness of breath.  You pass out (faint). Summary  When back pain lasts longer than 3 months, it is called chronic back pain.  Pain may get worse at certain times (flare-ups).  Use ice and heat as told by your doctor. Your doctor may tell you to use ice after flare-ups. This information is not intended to replace advice  given to you by your health care provider. Make sure you discuss any questions you have with your health care provider. Document Released: 01/04/2008 Document Revised: 03/02/2017 Document Reviewed: 03/02/2017 Elsevier Interactive Patient Education  2019 [employer]      Murlean Iba, RN DNP [employer] [facility]

## 2018-10-10 NOTE — Patient Instructions (Addendum)
Great meeting you today.  Instructions: -Massage therapy twice a week for a couple months then monthly as maintenance -apply ice as needed, aim for twice a day -Take Motrin as needed -Back stretches daily.   Chronic Back Pain When back pain lasts longer than 3 months, it is called chronic back pain. Pain may get worse at certain times (flare-ups). There are things you can do at home to manage your pain. Follow these instructions at home: Activity      Avoid bending and other activities that make pain worse.  When standing: ? Keep your upper back and neck straight. ? Keep your shoulders pulled back. ? Avoid slouching.  When sitting: ? Keep your back straight. ? Relax your shoulders. Do not round your shoulders or pull them backward.  Do not sit or stand in one place for long periods of time.  Take short rest breaks during the day. Lying down or standing is usually better than sitting. Resting can help relieve pain.  When sitting or lying down for a long time, do some mild activity or stretching. This will help to prevent stiffness and pain.  Get regular exercise. Ask your doctor what activities are safe for you.  Do not lift anything that is heavier than 10 lb (4.5 kg). To prevent injury when you lift things: ? Bend your knees. ? Keep the weight close to your body. ? Avoid twisting. Managing pain  If told, put ice on the painful area. Your doctor may tell you to use ice for 24-48 hours after a flare-up starts. ? Put ice in a plastic bag. ? Place a towel between your skin and the bag. ? Leave the ice on for 20 minutes, 2-3 times a day.  If told, put heat on the painful area as often as told by your doctor. Use the heat source that your doctor recommends, such as a moist heat pack or a heating pad. ? Place a towel between your skin and the heat source. ? Leave the heat on for 20-30 minutes. ? Remove the heat if your skin turns bright red. This is especially important if  you are unable to feel pain, heat, or cold. You may have a greater risk of getting burned.  Soak in a warm bath. This can help relieve pain.  Take over-the-counter and prescription medicines only as told by your doctor. General instructions  Sleep on a firm mattress. Try lying on your side with your knees slightly bent. If you lie on your back, put a pillow under your knees.  Keep all follow-up visits as told by your doctor. This is important. Contact a doctor if:  You have pain that does not get better with rest or medicine. Get help right away if:  One or both of your arms or legs feel weak.  One or both of your arms or legs lose feeling (numbness).  You have trouble controlling when you poop (bowel movement) or pee (urinate).  You feel sick to your stomach (nauseous).  You throw up (vomit).  You have belly (abdominal) pain.  You have shortness of breath.  You pass out (faint). Summary  When back pain lasts longer than 3 months, it is called chronic back pain.  Pain may get worse at certain times (flare-ups).  Use ice and heat as told by your doctor. Your doctor may tell you to use ice after flare-ups. This information is not intended to replace advice given to you by your health care provider.  Make sure you discuss any questions you have with your health care provider. Document Released: 01/04/2008 Document Revised: 03/02/2017 Document Reviewed: 03/02/2017 Elsevier Interactive Patient Education  2019 [employer].

## 2019-03-26 DIAGNOSIS — T7840XA Allergy, unspecified, initial encounter: Secondary | ICD-10-CM | POA: Diagnosis not present
